# Patient Record
Sex: Female | Born: 1940 | Race: White | Hispanic: No
Health system: Southern US, Community
[De-identification: ages and names within clinical notes are randomized; demographics above are authoritative.]

---

## 2004-11-12 ENCOUNTER — Inpatient Hospital Stay (HOSPITAL_COMMUNITY): Admission: RE | Admit: 2004-11-12 | Discharge: 2004-11-18 | Payer: Self-pay | Admitting: Orthopaedic Surgery

## 2006-02-23 IMAGING — CR DG CHEST 2V
2 series · 2 of 2 positions shown · non-contrast
Comparison: none

CLINICAL DATA: Preoperative respiratory exam for knee surgery. 
 CHEST 2 VIEWS:
 Heart size is normal.  The mediastinum is unremarkable.  The lungs are clear.  Ordinary degenerative changes affect the spine.

[view not recorded (1 of 2)]
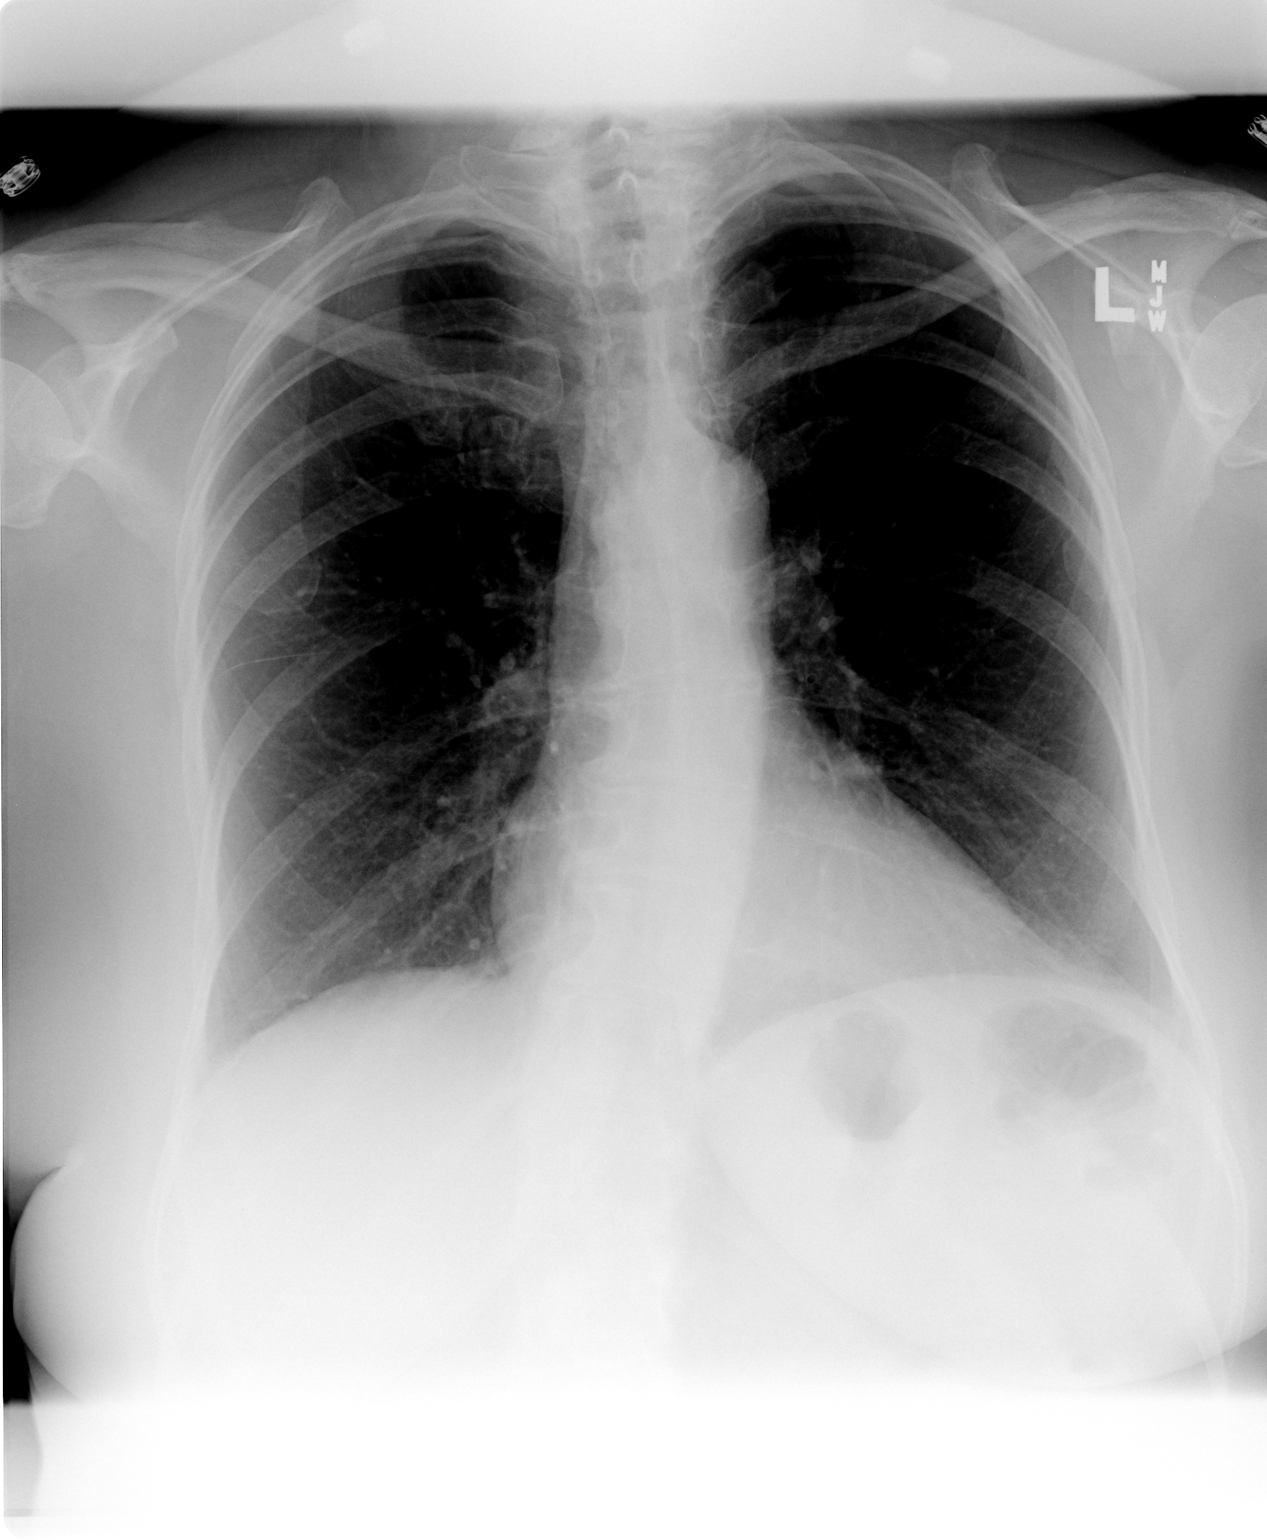

[view not recorded (2 of 2)]
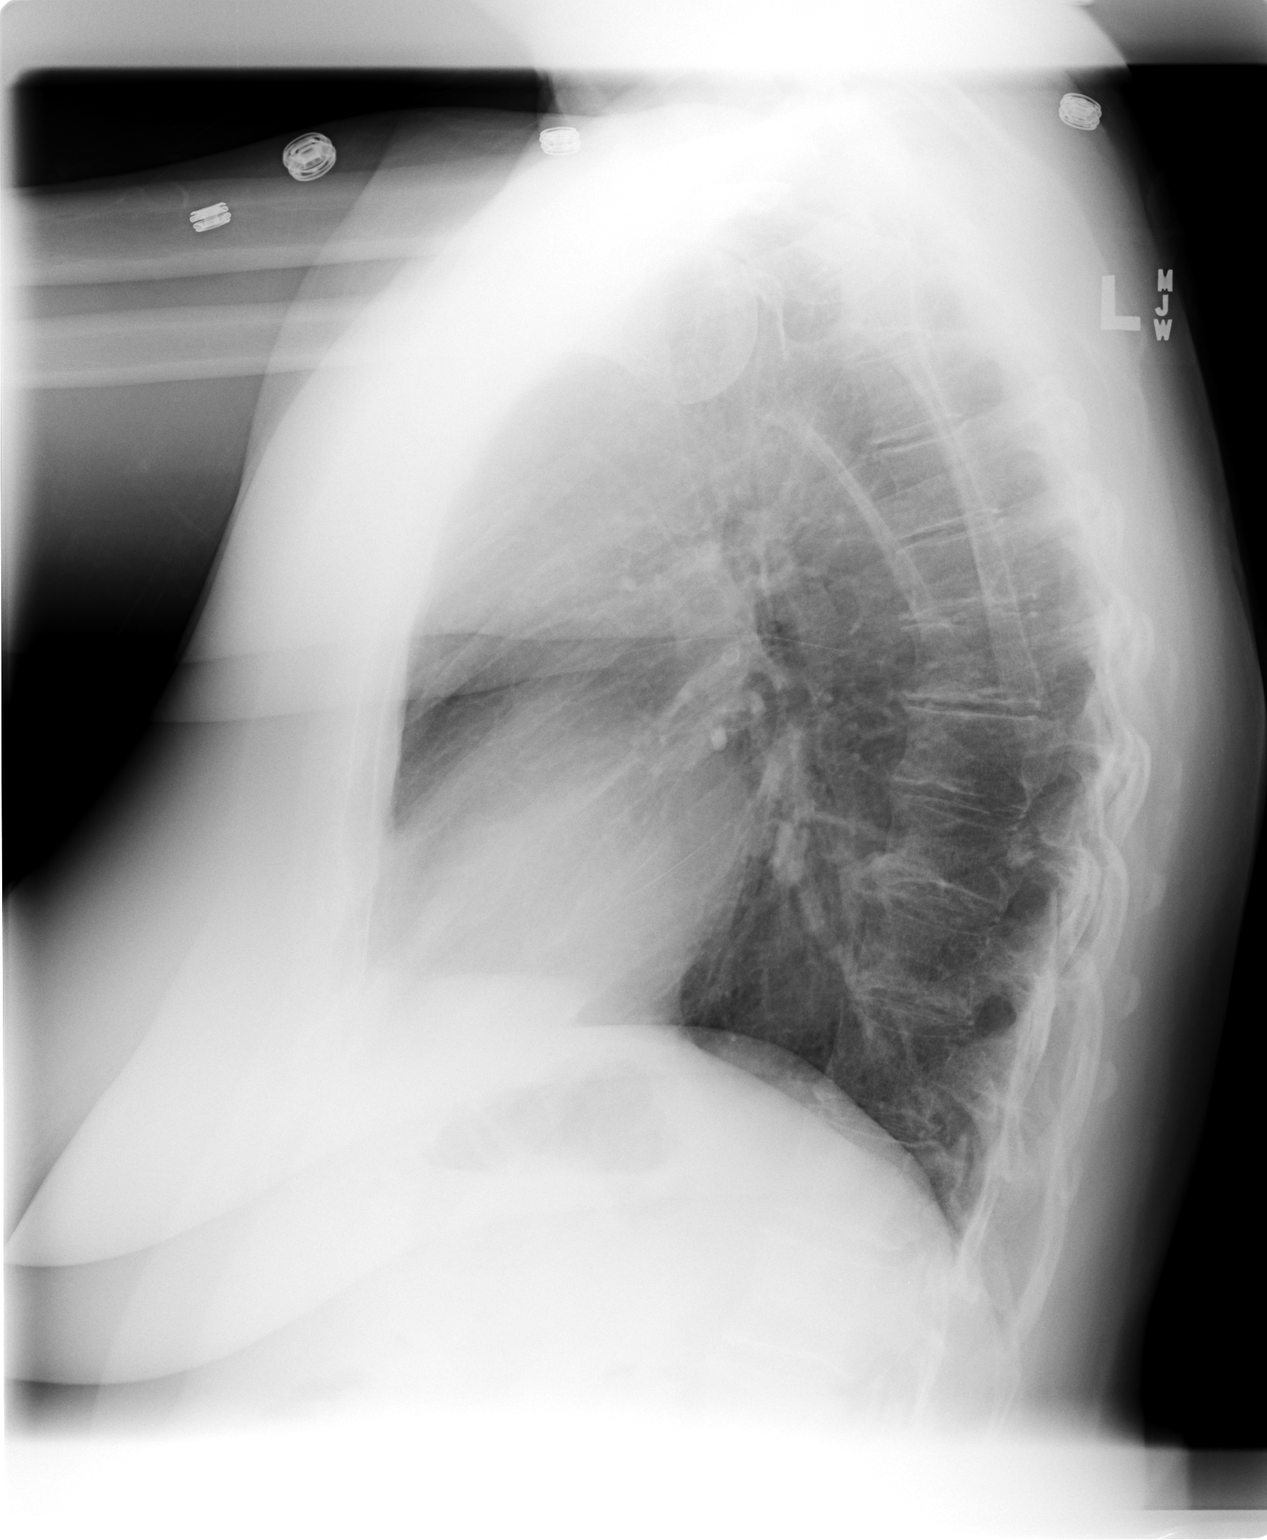

[2 of 2 positions shown; findings below may reference images not displayed]

IMPRESSION: No active disease.

## 2011-02-28 NOTE — Op Note (Signed)
Alexandra Andrews, COBERN             ACCOUNT NO.:  [id number]   MEDICAL RECORD NO.:  [id number]          PATIENT TYPE:  INP   LOCATION:  NA                           FACILITY:  [facility]   PHYSICIAN:  Lubertha Basque. Dalldorf, M.D.DATE OF BIRTH:  1941-04-13   DATE OF PROCEDURE:  11/12/2004  DATE OF DISCHARGE:                                 OPERATIVE REPORT   PREOPERATIVE DIAGNOSIS:  Right knee degenerative arthritis.   POSTOPERATIVE DIAGNOSIS:  Right knee degenerative arthritis.   PROCEDURE:  Right total knee replacement.   ANESTHESIA:  General and femoral nerve block.   ATTENDING SURGEON:  Lubertha Basque. Jerl Santos, M.D.   ASSISTANTS:  1.  Charlesetta Shanks, M.D., and Lindwood Qua, P.A.   INDICATIONS FOR PROCEDURE:  The patient is a 70 year old woman with a long  history of bilateral knee pain.  She has persisted with pain especially on  the right despite use of many different oral anti-inflammatories and several  attempts at injections of different substances.  She also has had an  arthroscopy of the right, which revealed some significant degenerative  change. With continued pain at rest and pain with activities, she is offered  a knee replacement.  Informed operative consent was obtained a discussion of  possible complications of, reaction to anesthesia, infection, DVT, PE, and  death.   DESCRIPTION OF PROCEDURE:  The patient was taken to the operating suite,  where a general anesthetic was applied without difficulty.  She was given a  femoral nerve block in the preanesthesia area.  She was positioned supine  and prepped and draped in normal sterile fashion.  After administration of  preop IV antibiotics, the right leg was elevated, exsanguinated, and a  tourniquet inflated about the thigh.  All appropriate anti-infective  measures were used, including the preoperative IV Kefzol, closed-hooded  exhaust systems for each member of the surgical team, and a Betadine-  impregnated drape.  A  longitudinal incision was made across the knee with  dissection down to the extensor mechanism.  A medial parapatellar incision  was made through this structure.  The kneecap was flipped in the knee  flexed.  She had severe degenerative change, medial and patellofemoral, with  moderate lateral change.  She had excellent bone quality.  An extramedullary  guide was placed on the tibia in order to make a cut with a slight posterior  tilt perpendicular to the shaft of the tibia.  The femur then had placement  of an extramedullary guide in order to create anterior and posterior cuts,  making a flexion gap of 10 about mm.  A second intramedullary guide was  placed in the femur in order to make a distal cut, creating an equal  extension gap of about 10 mm.  The femur sized to a size standard plus  component, while the tibia was a 3, and the appropriate guides were placed  and utilized.  The trial reduction was done with these components and 10 mm  spacer, and the knee easily came to full extension with flexion to 120.  The  kneecap tracked well, and no lateral release  was required.  The patella was  cut down in thickness by about 10 mm from 24 down to 14 and sized to a 38  with the appropriate guide placed and utilized.  The trial components were  removed, a pulsatile lavage was used to cleanse all cut bony surfaces.  Cement was mixed including antibiotic and was pressurized onto all three  bones, followed by placement of the aforementioned DePuy LCS components,  which were standard plus femur, 3 tibia, 10 mm deep dish spacer, and 38 mm  all-polyethylene patella.  Excess cement was trimmed and pressure was held  on the components until the cement had hardened completely.  The tourniquet  was deflated and small amount of bleeding was easily controlled with Bovie  cautery.  She really had such a paucity of bleeding that we decided not to  place a drain.  After another round of irrigation, the  extensor mechanism  was reapproximated with #1 Vicryl in interrupted fashion.  Subcutaneous  tissue was reapproximated in two layers with Vicryl, followed by skin  closure with staples.  Adaptic was placed over the wound, followed by dry  gauze and a loose Ace wrap.  Estimated blood loss and fluids can be obtained  from anesthesia records as can accurate tourniquet time, which was less than  an hour.  The knee flexed easily to 120 against gravity at the end of the  case.   DISPOSITION:  The patient was extubated in the operating room and taken to  the recovery room in stable condition.  Plans were for her to be admitted  the orthopedic surgery service for appropriate postop care to include  perioperative antibiotics and Coumadin plus Lovenox for DVT prophylaxis.      PGD/MEDQ  D:  11/12/2004  T:  11/12/2004  Job:  [id number]

## 2011-02-28 NOTE — Discharge Summary (Signed)
Alexandra Andrews, Alexandra Andrews             ACCOUNT NO.:  [id number]   MEDICAL RECORD NO.:  [id number]          PATIENT TYPE:  INP   LOCATION:  5001                         FACILITY:  [facility]   PHYSICIAN:  Lubertha Basque. Dalldorf, M.D.DATE OF BIRTH:  1940-11-20   DATE OF ADMISSION:  11/12/2004  DATE OF DISCHARGE:  11/18/2004                                 DISCHARGE SUMMARY   ADMISSION DIAGNOSES:  1.  Right knee degenerative joint disease, end-stage.  2.  Left knee degenerative joint disease.  3.  Hypertension.   DISCHARGE DIAGNOSES:  1.  Right knee degenerative joint disease, end-stage.  2.  Left knee degenerative joint disease.  3.  Hypertension.   OPERATION:  Right total knee replacement.   BRIEF HISTORY:  Alexandra Andrews is a 70 year old white female patient well  known to our practice who has been complaining of longstanding history of  right and left knee pain right greater than left, pain when she walks, pain  when she sleeps at nighttime. It does awaken her from sleep. We had taken x-  rays which show end-stage DJD. She has also had injections of  corticosteroids and has failed these. We have discussed treatment options  with the patient, that being total knee replacement, the risks of  anesthesia, infection, DVT, and possible death.   PERTINENT LABORATORY AND X-RAY FINDINGS:  Her last INR was 2.5, hemoglobin  10.6. Blood glucose of 119, potassium 4.3, chloride 106, sodium 138,  creatinine 0.8, BUN 7. She had no active disease on a chest x-ray.  Cardiology showed a normal sinus rhythm.   COURSE IN THE HOSPITAL:  The patient was admitted postoperatively from the  operating room and was placed on IV lactated ringers 80 cc an hour, Ancef 1  gm q.8h. IV times six doses, Colace, laxative of choice, Percocet, Phenergan  as needed for nausea, Skelaxin for muscle spasm, and Lovenox and Coumadin  for DVT protocol. She was also kept on Benicar 40 mg daily, Norvasc 5 mg  daily, and Nasonex spray  once a day. Foley catheter for straight drainage  which was later discontinued on the 2nd day postoperatively. Incentive  spirometry q.1h., knee-high TUVs, and a knee immobilizer when walking for  comfort. CPM machine 0-50 q.8h. in advance as tolerated with PT, OT, and  rehab consults. Blood draws for hemoglobin and hematocrit, BMET panels, and  protimes were drawn regularly. She is weightbearing as tolerated. The first  day in the hospital blood pressure was 116/64, heart rate 73, temperature  97. Hemoglobin was 11.3 and it only showed a minimal drop during her  hospital stay. Potassium was 3.4, INR 1.1. Lungs were clear to A&P as well  as abdomen had positive bowel sounds. Her lungs and abdomen continued to be  normal throughout her hospital stay. Her knee motion was 0-50 on the CPM,  but no sign of infection or irritation. Her PCA was working. Her Foley  catheter was in place the second day postoperatively. The catheter was  removed. The PCA was discontinued after her last dose of her antibiotics.  Her blood pressure was 122/69, heart rate  96, and temperature was 97. Once  again there was no sign of irritation or infection in her knee wound and her  dressing was changed on that day. Alexandra Andrews made rounds over the weekend.  We had hoped that the patient would be a candidate for the rehab unit, but  she progressed well without physical therapy and it was felt that she could  be discharged home.  She did have [place] consult on her home physical  therapy and home blood draws. She had some swelling on November 16, 2004,  postoperative day #4 in her right lower leg and a venous Doppler was done  and this was normal. There was no clot noted. She was discharged home.   CONDITION ON DISCHARGE:  Improved.   FOLLOWUP:  She will remain Coumadin dose per pharmacy, Avapro 300 mg,  Nasonex one spray a day, Norvasc 5 mg daily, Benicar 40 mg daily, Vicodin  one or two q.4-6h. p.r.n. pain, Gentiva for  home physical therapy and blood  draws. Weightbearing as tolerated. Regular diet, no restrictions. Dressing  change daily. Ice, elevation, and any signs of infection to make an  appointment and call [phone].      MC/MEDQ  D:  11/18/2004  T:  11/18/2004  Job:  [id number]

## 2019-11-25 ENCOUNTER — Ambulatory Visit: Payer: Self-pay

## 2019-11-27 ENCOUNTER — Ambulatory Visit: Payer: Self-pay | Attending: Internal Medicine

## 2021-12-17 ENCOUNTER — Telehealth: Payer: Self-pay

## 2021-12-17 NOTE — Telephone Encounter (Signed)
Spoke with patient regarding scheduling Palliative Care consult. Patient declined services at this time. Will cancel referral and notify referring provider.  ?

## 2021-12-17 NOTE — Telephone Encounter (Signed)
Attempted to contact patient and patient's spouse to schedule a Palliative Care consult appointment. Contact numbers are invalid.  ? ?

## 2021-12-20 ENCOUNTER — Telehealth: Payer: Self-pay

## 2021-12-20 NOTE — Telephone Encounter (Signed)
Attempted to contact patient's daughter Jasmine December to schedule a Palliative Care consult appointment. No answer left a message to return call.  ? ?

## 2021-12-26 ENCOUNTER — Telehealth: Payer: Self-pay

## 2021-12-26 NOTE — Telephone Encounter (Signed)
Spoke with patient and patient's daughter Ivin Booty regarding Palliative Care services. Daughter requested to contact the patient after note on referral stated to contact Ivin Booty. I spoke with patient she is very confused regarding services. She states she has a appointment with Dr. Pecola Lawless office today and will discuss with the, She states she will call back on 3/17. ?

## 2021-12-31 ENCOUNTER — Telehealth: Payer: Self-pay

## 2021-12-31 NOTE — Telephone Encounter (Signed)
Spoke with patient regarding Palliative care services on 3/16. Patient stated she was going to see Dr. Bernette Mayers the next day and would discuss with him. I called back today to follow up and she answered. When I told her who I was and where I was calling and asked if she discussed with Dr. Bernette Mayers she said no and hung up the phone. I tried to call two more times and when I started talking, she would hang up on me. This is the second referral that has been sent and unable to get patient scheduled. Will cancel referral.  ?
